# Patient Record
Sex: Male | Born: 1980 | Race: White | Hispanic: No | Marital: Single | Smoking: Former smoker
Health system: Southern US, Community
[De-identification: ages and names within clinical notes are randomized; demographics above are authoritative.]

---

## 2008-07-09 ENCOUNTER — Encounter: Payer: Self-pay | Admitting: Orthopedic Surgery

## 2008-08-15 ENCOUNTER — Ambulatory Visit: Payer: Self-pay | Admitting: Orthopedic Surgery

## 2008-08-15 DIAGNOSIS — M24819 Other specific joint derangements of unspecified shoulder, not elsewhere classified: Secondary | ICD-10-CM

## 2008-08-15 DIAGNOSIS — M25519 Pain in unspecified shoulder: Secondary | ICD-10-CM

## 2008-08-17 ENCOUNTER — Encounter (INDEPENDENT_AMBULATORY_CARE_PROVIDER_SITE_OTHER): Payer: Self-pay | Admitting: *Deleted

## 2008-08-17 DIAGNOSIS — R209 Unspecified disturbances of skin sensation: Secondary | ICD-10-CM

## 2008-08-20 ENCOUNTER — Telehealth: Payer: Self-pay | Admitting: Orthopedic Surgery

## 2008-08-23 ENCOUNTER — Encounter: Payer: Self-pay | Admitting: Orthopedic Surgery

## 2008-08-24 ENCOUNTER — Encounter: Payer: Self-pay | Admitting: Orthopedic Surgery

## 2008-08-28 ENCOUNTER — Encounter: Payer: Self-pay | Admitting: Orthopedic Surgery

## 2008-08-31 ENCOUNTER — Ambulatory Visit (HOSPITAL_COMMUNITY): Admission: RE | Admit: 2008-08-31 | Discharge: 2008-08-31 | Payer: Self-pay | Admitting: Orthopedic Surgery

## 2008-09-04 ENCOUNTER — Encounter: Payer: Self-pay | Admitting: Orthopedic Surgery

## 2008-09-20 ENCOUNTER — Ambulatory Visit: Payer: Self-pay | Admitting: Orthopedic Surgery

## 2008-09-20 DIAGNOSIS — M5412 Radiculopathy, cervical region: Secondary | ICD-10-CM | POA: Insufficient documentation

## 2009-02-12 ENCOUNTER — Ambulatory Visit (HOSPITAL_COMMUNITY): Admission: RE | Admit: 2009-02-12 | Discharge: 2009-02-12 | Payer: Self-pay | Admitting: Family Medicine

## 2012-03-03 ENCOUNTER — Other Ambulatory Visit (HOSPITAL_COMMUNITY): Payer: Self-pay | Admitting: Family Medicine

## 2012-03-03 DIAGNOSIS — M542 Cervicalgia: Secondary | ICD-10-CM

## 2012-03-10 ENCOUNTER — Other Ambulatory Visit (HOSPITAL_COMMUNITY): Payer: Self-pay

## 2012-03-11 ENCOUNTER — Ambulatory Visit (HOSPITAL_COMMUNITY): Admission: RE | Admit: 2012-03-11 | Payer: Medicare Other | Source: Ambulatory Visit

## 2012-03-11 ENCOUNTER — Ambulatory Visit (HOSPITAL_COMMUNITY): Payer: Medicare Other

## 2012-03-18 ENCOUNTER — Ambulatory Visit (HOSPITAL_COMMUNITY)
Admission: RE | Admit: 2012-03-18 | Discharge: 2012-03-18 | Disposition: A | Discharge status: Home / Self Care | Payer: Medicare Other | Source: Ambulatory Visit | Attending: Family Medicine | Admitting: Family Medicine

## 2012-03-18 DIAGNOSIS — M5020 Other cervical disc displacement, unspecified cervical region: Secondary | ICD-10-CM | POA: Insufficient documentation

## 2012-03-18 DIAGNOSIS — M542 Cervicalgia: Secondary | ICD-10-CM

## 2016-08-17 ENCOUNTER — Other Ambulatory Visit (HOSPITAL_COMMUNITY): Payer: Self-pay | Admitting: Family Medicine

## 2016-08-17 DIAGNOSIS — M542 Cervicalgia: Secondary | ICD-10-CM

## 2016-08-21 ENCOUNTER — Ambulatory Visit (HOSPITAL_COMMUNITY): Payer: Medicare Other

## 2016-08-25 ENCOUNTER — Other Ambulatory Visit (HOSPITAL_COMMUNITY): Payer: Medicare Other

## 2016-08-25 ENCOUNTER — Ambulatory Visit (HOSPITAL_COMMUNITY): Payer: Medicare Other

## 2016-09-01 ENCOUNTER — Ambulatory Visit (HOSPITAL_COMMUNITY)
Admission: RE | Admit: 2016-09-01 | Discharge: 2016-09-01 | Disposition: A | Discharge status: Home / Self Care | Payer: Medicare Other | Source: Ambulatory Visit | Attending: Family Medicine | Admitting: Family Medicine

## 2016-09-01 DIAGNOSIS — M50223 Other cervical disc displacement at C6-C7 level: Secondary | ICD-10-CM | POA: Insufficient documentation

## 2016-09-01 DIAGNOSIS — M542 Cervicalgia: Secondary | ICD-10-CM

## 2016-09-01 DIAGNOSIS — M5412 Radiculopathy, cervical region: Secondary | ICD-10-CM | POA: Insufficient documentation

## 2019-01-19 IMAGING — MR MR CERVICAL SPINE W/O CM
4 of 5 series · 14 of 48 positions shown · non-contrast
Comparison: Cervical spine MRI 03/18/2012.

CLINICAL DATA: Neck pain and bilateral arm numbness for over 10
years.

EXAM:
MRI CERVICAL SPINE WITHOUT CONTRAST
TECHNIQUE: Multiplanar, multisequence MR imaging of the cervical spine was
performed. No intravenous contrast was administered.

[Series 3: T2 · sagittal · 3.0mm · 0.39mm/px · 5 of 13 slices shown (1 of 2)]
[im 1/13]
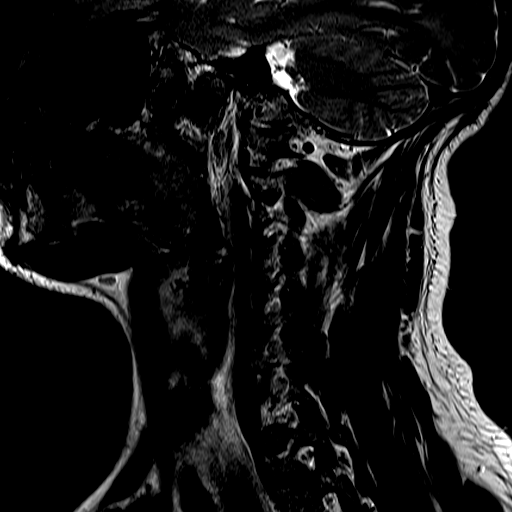
[im 3/13]
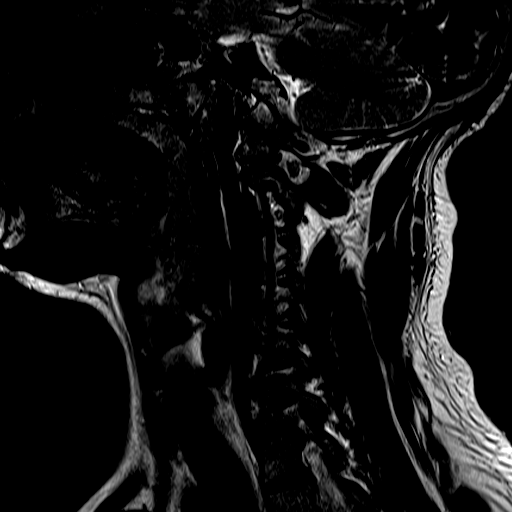
[im 5/13]
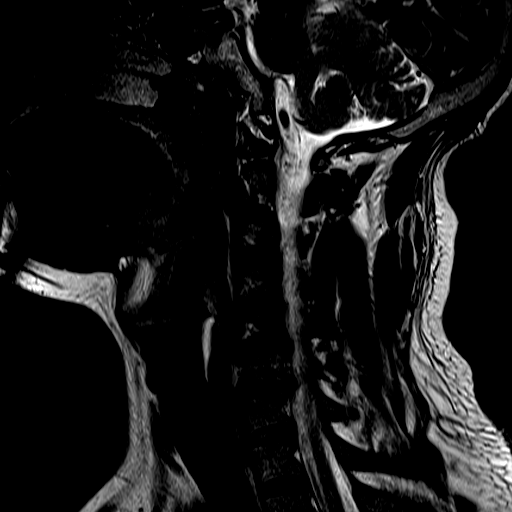
[im 8/13]
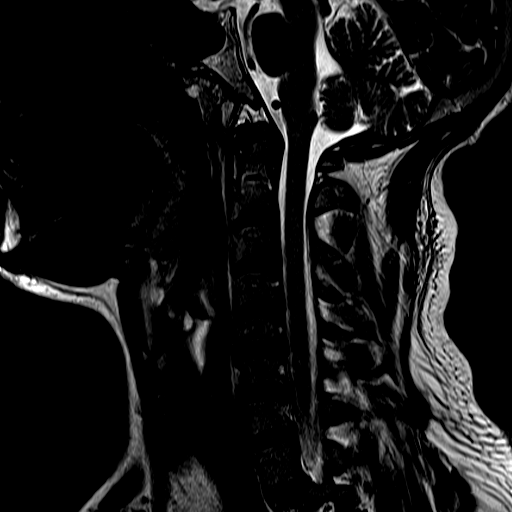
[im 13/13]
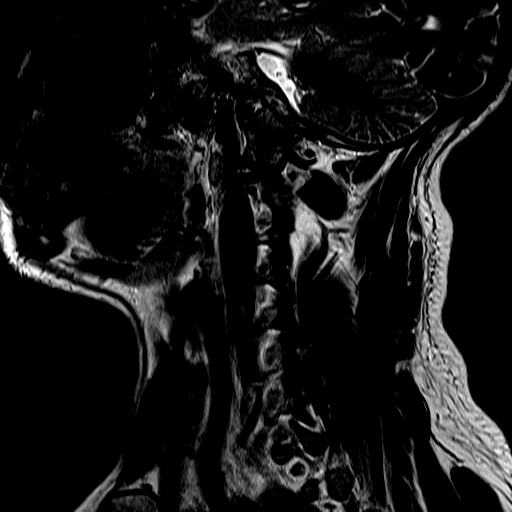

[Series 4: FLAIR · sagittal · 3.0mm · 0.45mm/px · 3 of 13 slices shown]
[im 3/13]
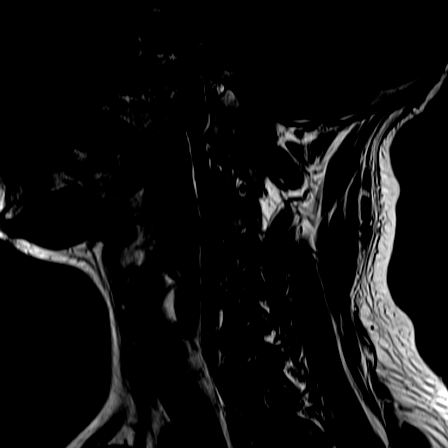
[im 8/13]
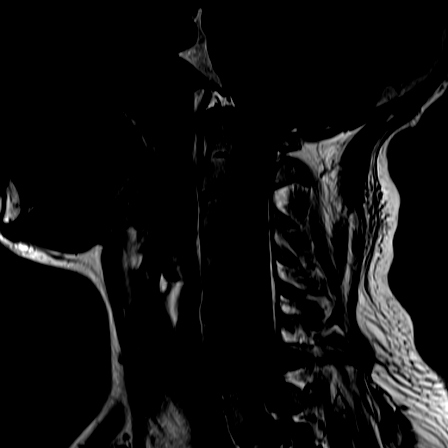
[im 13/13]
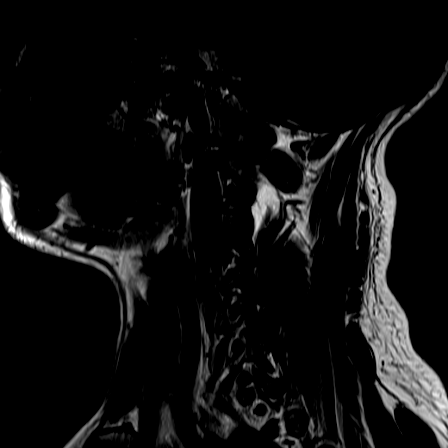

[Series 5: ir sagital · sagittal · 3.0mm · 0.22mm/px · 3 of 13 slices shown]
[im 3/13]
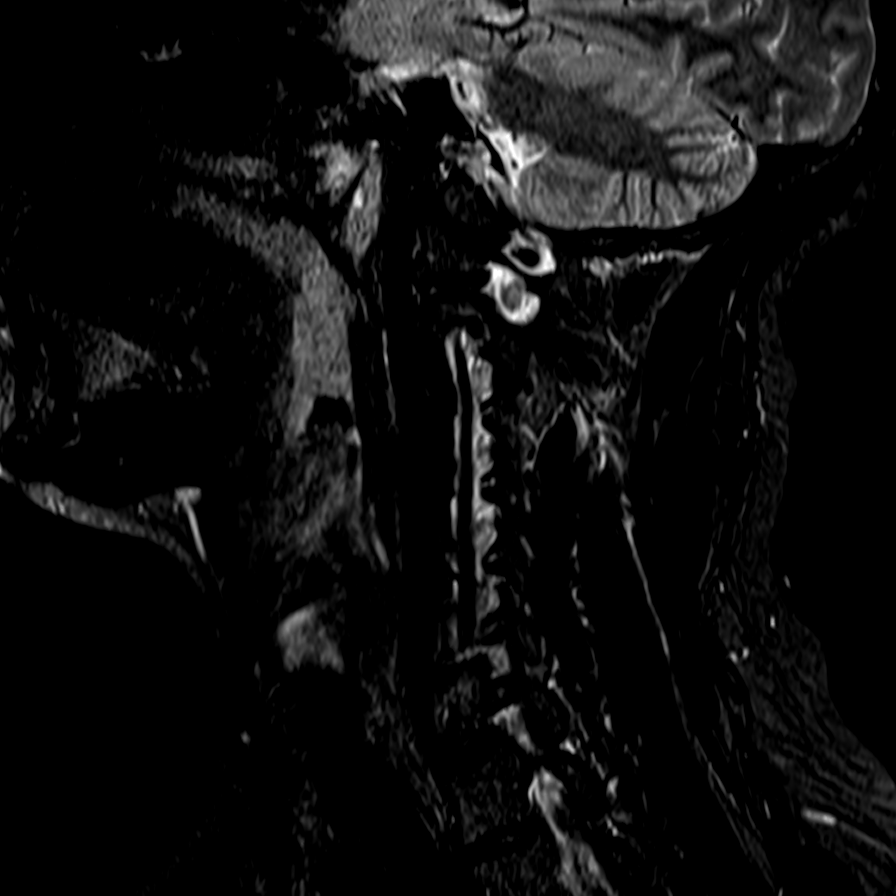
[im 8/13]
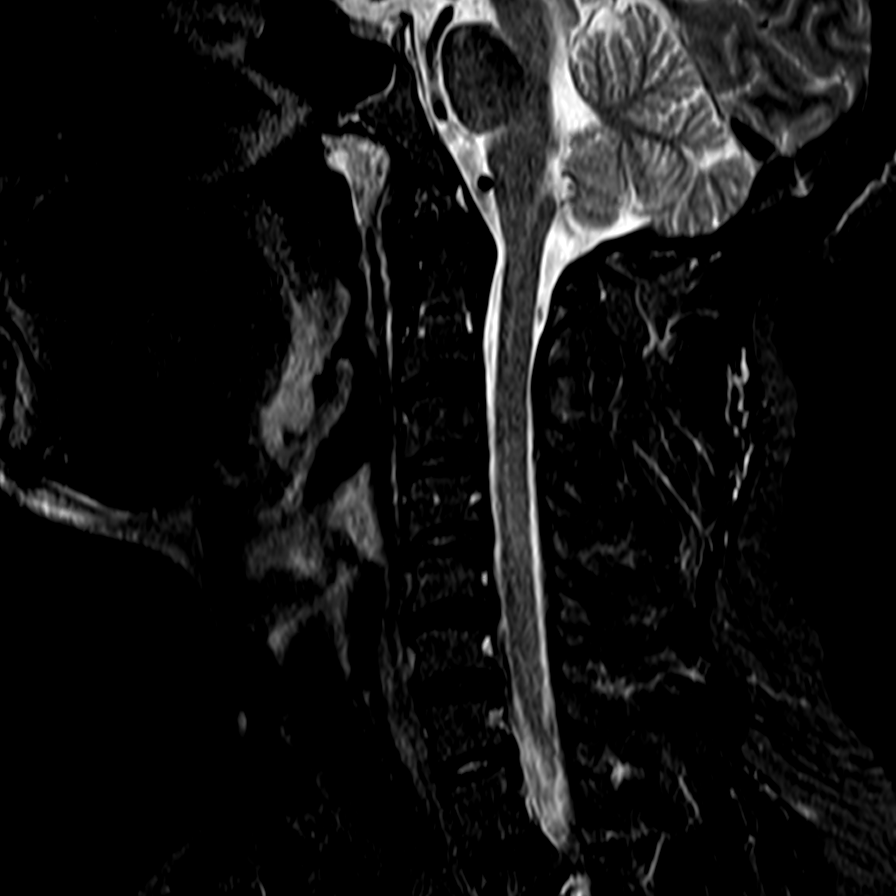
[im 13/13]
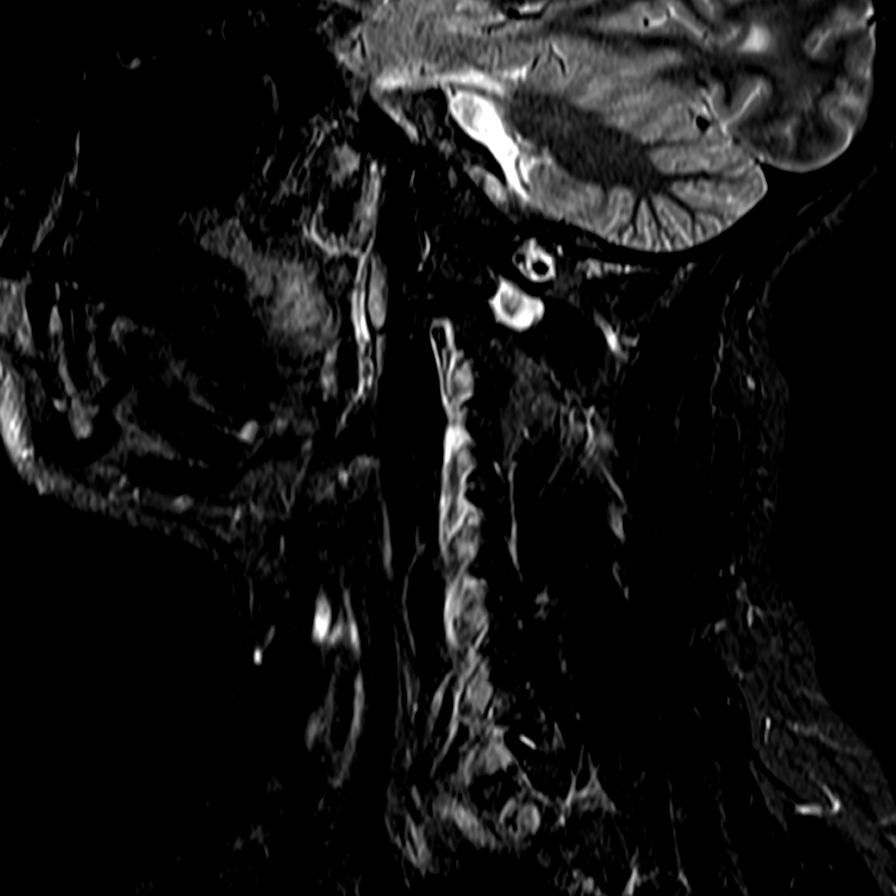

[Series 7: T2 · axial · 3.0mm · 0.23mm/px · z∈[-41,+30]mm · 3 of 32 slices shown (2 of 2)]
[im 5/32]
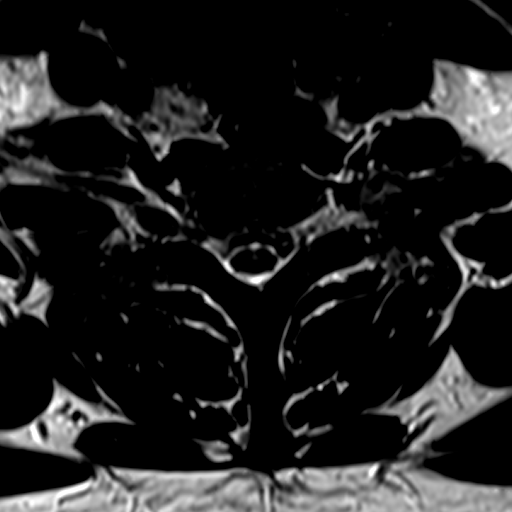
[im 16/32]
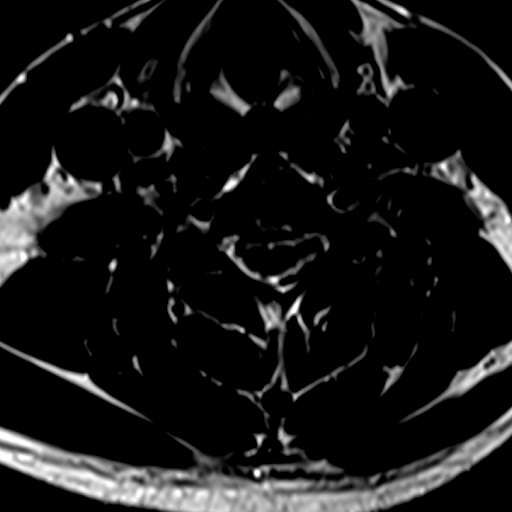
[im 27/32]
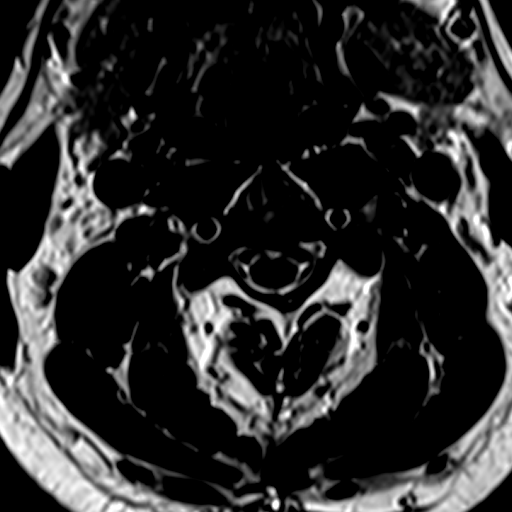

[14 of 48 positions shown; findings below may reference images not displayed]

FINDINGS: Alignment: Normal.

Vertebrae: Height and signal are maintained.

Cord: Normal signal throughout.

Posterior Fossa, vertebral arteries, paraspinal tissues: Negative.

Disc levels:

C2-3:  Negative.

C3-4: Minimal disc bulge and uncovertebral disease on the left. Mild
left foraminal narrowing is seen. The central canal and right
foramen are open.

C4-5:  Negative.

C5-6: Shallow disc bulge and uncovertebral spurring on the right.
Mild right foraminal narrowing is unchanged. The central canal and
left foramen are open.

C6-7: Minimal disc bulge with a superimposed right side disc
protrusion at the foraminal entry zone. The disc could potentially
irritate the right C7 root. The left foramen is open. The appearance
is unchanged.

C7-T1:  Negative.
IMPRESSION: No change in the appearance of the cervical spine. No new
abnormality.

Right paracentral protrusion at C6-7 at the foraminal entry zone
could irritate the right C7 root.

No change in mild left foraminal narrowing at C3-4 and right
foraminal narrowing at C5-6 due to uncovertebral disease

## 2022-07-09 ENCOUNTER — Ambulatory Visit (INDEPENDENT_AMBULATORY_CARE_PROVIDER_SITE_OTHER): Payer: Medicaid Other | Admitting: Podiatry

## 2022-07-09 DIAGNOSIS — Z91199 Patient's noncompliance with other medical treatment and regimen due to unspecified reason: Secondary | ICD-10-CM

## 2022-07-09 NOTE — Progress Notes (Signed)
No show for apt, charge generated 

## 2022-07-31 ENCOUNTER — Ambulatory Visit: Payer: Medicare Other | Admitting: Urology

## 2022-07-31 DIAGNOSIS — R3129 Other microscopic hematuria: Secondary | ICD-10-CM

## 2022-09-07 ENCOUNTER — Ambulatory Visit: Payer: Medicare Other | Admitting: Urology

## 2022-09-07 DIAGNOSIS — R310 Gross hematuria: Secondary | ICD-10-CM

## 2022-11-06 ENCOUNTER — Telehealth: Payer: Self-pay

## 2022-11-06 NOTE — Telephone Encounter (Signed)
Tried returning call from triage line with no answer, left vm for return call.

## 2022-12-08 ENCOUNTER — Encounter: Payer: Self-pay | Admitting: Gastroenterology

## 2022-12-08 ENCOUNTER — Ambulatory Visit (INDEPENDENT_AMBULATORY_CARE_PROVIDER_SITE_OTHER): Payer: Medicare Other | Admitting: Gastroenterology

## 2022-12-08 VITALS — BP 154/102 | HR 71 | Temp 97.7°F | Ht 71.0 in | Wt 212.6 lb

## 2022-12-08 DIAGNOSIS — Z5321 Procedure and treatment not carried out due to patient leaving prior to being seen by health care provider: Secondary | ICD-10-CM

## 2022-12-09 ENCOUNTER — Encounter: Payer: Self-pay | Admitting: Gastroenterology

## 2022-12-09 NOTE — Progress Notes (Signed)
Patient decided to reschedule appointment and perform recommendations given by PCP. He spoke to [facility] and front desk that he will call back to reschedule. Patient not seen by me today. No charge for visit.

## 2022-12-17 NOTE — Progress Notes (Deleted)
Name: Raymond Stevens DOB: 1980/04/05 MRN: [id number]  History of Present Illness: Mr. Raymond Stevens is a 42 y.o. male who presents today as a new patient at [facility] [place].  Recent relevant imaging: > 07/09/2022: CT abdomen/pelvis w/o contrast showed no GU stones, masses, or hydronephrosis.  > 08/19/2022: MRI pelvis showed:  - "The previously demonstrated well-circumscribed lesion in the left iliac bone demonstrates no aggressive features and has imaging features most consistent with an incidental finding such as fibrous dysplasia, intraosseous lipoma or benign cystic lesion. There is no associated soft tissue mass, periosteal reaction or abnormal soft tissue enhancement." - Distal ureters, bladder, and prostate gland appeared unremarkable.  Today: He reports chief complaint of ***gross hematuria. He reports that the visible hematuria was first noticed *** ago and {gen continuous/intermittent/once:313061} (***still ongoing ***?). He {Actions; denies-reports:120008} prior history of gross hematuria.   Today He {Actions; denies-reports:120008} urinary urgency, frequency, dysuria, straining to void, or sensations of incomplete emptying. He {Actions; denies-reports:120008} ***abdominal or ***flank pain. He {Actions; denies-reports:120008} fevers.  He {Actions; denies-reports:120008} history of kidney stones.  He {Actions; denies-reports:120008} history of pyelonephritis.  He {Actions; denies-reports:120008} history of recent or recurrent UTI. He {Actions; denies-reports:120008} history of GU malignancy or pelvic radiation.  He {Actions; denies-reports:120008} history of autoimmune disease. He {Actions; denies-reports:120008} history of smoking (quit ***; has smoked*** ppd x*** years). He {Actions; denies-reports:120008} known occupational risks. He {Actions; denies-reports:120008} recent vigorous exercise which they think may be contributory to hematuria. He {Actions;  denies-reports:120008} any recent trauma or prolonged pressure to the perineal area. He {Actions; denies-reports:120008} recent illness. He {Actions; denies-reports:120008} taking anticoagulants (***).   Fall Screening: Do you usually have a device to assist in your mobility? {yes/no:20286} ***cane / ***walker / ***wheelchair  Medications: No current outpatient medications on file.   No current facility-administered medications for this visit.    Allergies: Not on File  No past medical history on file. No past surgical history on file. No family history on file. Social History   Socioeconomic History   Marital status: Single    Spouse name: Not on file   Number of children: Not on file   Years of education: Not on file   Highest education level: Not on file  Occupational History   Not on file  Tobacco Use   Smoking status: Former    Types: Cigarettes   Smokeless tobacco: Former  Substance and Sexual Activity   Alcohol use: Not Currently   Drug use: Not Currently   Sexual activity: Yes  Other Topics Concern   Not on file  Social History Narrative   Not on file   Social Determinants of Health   Financial Resource Strain: Not on file  Food Insecurity: Not on file  Transportation Needs: Not on file  Physical Activity: Not on file  Stress: Not on file  Social Connections: Not on file  Intimate Partner Violence: Not on file    SUBJECTIVE  Review of Systems Constitutional: Patient ***denies any unintentional weight loss or change in strength lntegumentary: Patient ***denies any rashes or pruritus Eyes: Patient denies ***dry eyes ENT: Patient ***denies dry mouth Cardiovascular: Patient ***denies chest pain or syncope Respiratory: Patient ***denies shortness of breath Gastrointestinal: Patient ***denies nausea, vomiting, constipation, or diarrhea Musculoskeletal: Patient ***denies muscle cramps or weakness Neurologic: Patient ***denies convulsions or  seizures Psychiatric: Patient ***denies memory problems Allergic/Immunologic: Patient ***denies recent allergic reaction(s) Hematologic/Lymphatic: Patient denies bleeding tendencies Endocrine: Patient ***denies heat/cold intolerance  GU: As per HPI.  OBJECTIVE  There were no vitals filed for this visit. There is no height or weight on file to calculate BMI.  Physical Examination Constitutional: ***No obvious distress; patient is ***non-toxic appearing  Cardiovascular: ***No visible lower extremity edema.  Respiratory: The patient does ***not have audible wheezing/stridor; respirations do ***not appear labored  Gastrointestinal: Abdomen ***non-distended Musculoskeletal: ***Normal ROM of UEs  Skin: ***No obvious rashes/open sores  Neurologic: CN 2-12 grossly ***intact Psychiatric: Answered questions ***appropriately with ***normal affect  Hematologic/Lymphatic/Immunologic: ***No obvious bruises or sites of spontaneous bleeding  UA: ***negative / *** WBC/hpf, *** RBC/hpf, bacteria (***) PVR: *** ml  ASSESSMENT No diagnosis found.  For management of gross hematuria, we discussed possible etiologies including but not limited to: vigorous exercise, sexual activity, stone, trauma, blood thinner use, urinary tract infection, kidney function, ***BPH, ***radiation cystitis, malignancy. ***We discussed pt's smoking as a risk factor for GU cancer and encouraged ***continued smoking cessation.***  We discussed the importance of work-up including assessing the upper and lower GU tract with CT urogram and cystoscopy. We will also check ***CMP, ***CBC, and ***voided cytology.  We discussed the risk for clot retention and pt was advised to increase fluid intake to thin out clots. Pt was advised to go to the ER if they become unable to urinate due to clot retention, start having symptoms of anemia (which were discussed), or any other significant concerning acute symptoms.  Pt verbalized understanding  and decided to pursue this work-up. Patient agreed to follow-up afterward to discuss the results and formulate a treatment plan based on the findings. All questions were answered.   PLAN Advised the following: CT ***ordered. Labs (CBC, CMP, ***PSA) ordered. Voided cytology ***ordered. 4. ***No follow-ups on file.  No orders of the defined types were placed in this encounter.   It has been explained that the patient is to follow regularly with their PCP in addition to all other providers involved in their care and to follow instructions provided by these respective offices. Patient advised to contact urology clinic if any urologic-pertaining questions, concerns, new symptoms or problems arise in the interim period.  There are no Patient Instructions on file for this visit.  Electronically signed by:  Donnita Falls, MSN, FNP-C, CUNP 12/17/2022 5:32 PM

## 2022-12-22 ENCOUNTER — Ambulatory Visit: Payer: Medicare Other | Admitting: Urology

## 2022-12-22 DIAGNOSIS — R319 Hematuria, unspecified: Secondary | ICD-10-CM

## 2023-09-10 ENCOUNTER — Ambulatory Visit (INDEPENDENT_AMBULATORY_CARE_PROVIDER_SITE_OTHER): Admitting: Urology

## 2023-09-10 ENCOUNTER — Encounter: Payer: Self-pay | Admitting: Urology

## 2023-09-10 VITALS — BP 138/87 | HR 67

## 2023-09-10 DIAGNOSIS — R3129 Other microscopic hematuria: Secondary | ICD-10-CM

## 2023-09-10 DIAGNOSIS — R351 Nocturia: Secondary | ICD-10-CM

## 2023-09-10 LAB — URINALYSIS, ROUTINE W REFLEX MICROSCOPIC
Bilirubin, UA: NEGATIVE
Glucose, UA: NEGATIVE
Ketones, UA: NEGATIVE
Leukocytes,UA: NEGATIVE
Nitrite, UA: NEGATIVE
Protein,UA: NEGATIVE
Specific Gravity, UA: 1.03 (ref 1.005–1.030)
Urobilinogen, Ur: 0.2 mg/dL (ref 0.2–1.0)
pH, UA: 6 (ref 5.0–7.5)

## 2023-09-10 LAB — MICROSCOPIC EXAMINATION: Bacteria, UA: NONE SEEN

## 2023-09-10 LAB — BLADDER SCAN AMB NON-IMAGING: Scan Result: 0

## 2023-09-10 MED ORDER — DOXYCYCLINE HYCLATE 100 MG PO CAPS: 100.0000 mg | ORAL_CAPSULE | Freq: Two times a day (BID) | ORAL | 0 refills | Status: AC

## 2023-09-10 MED ORDER — ALFUZOSIN HCL ER 10 MG PO TB24: 10.0000 mg | ORAL_TABLET | Freq: Every day | ORAL | 11 refills | Status: AC

## 2023-09-10 NOTE — Progress Notes (Signed)
   09/10/2023 11:20 AM   Raymond Stevens 08/14/1980 [id number]  Referring provider: Lauran Pollard, MD [address] [place],  [place] [address]  Microhematuria   HPI: Mr Raymond Stevens is a 43yo here for evaluation of microscopic hematuria. UA today shows 11-30 RBCs. IPSS 20 QOL 6 on no BPH therapy. For the past 3 months he has noted worsening LUTS. Urine stream is weak, a feeling of incomplete emptying, nocturia 2x. No dysuria and no gross hematuria. He has pain and discomfort with ejaculation.    PMH: No past medical history on file.  Surgical History: No past surgical history on file.  Home Medications:  Allergies as of 09/10/2023   Not on File      Medication List    as of September 10, 2023 11:20 AM   You have not been prescribed any medications.     Allergies: Not on File  Family History: No family history on file.  Social History:  reports that he has quit smoking. His smoking use included cigarettes. He has quit using smokeless tobacco. He reports that he does not currently use alcohol. He reports that he does not currently use drugs.  ROS: All other review of systems were reviewed and are negative except what is noted above in HPI  Physical Exam: BP 138/87   Pulse 67   Constitutional:  Alert and oriented, No acute distress. HEENT: [place] AT, moist mucus membranes.  Trachea midline, no masses. Cardiovascular: No clubbing, cyanosis, or edema. Respiratory: Normal respiratory effort, no increased work of breathing. GI: Abdomen is soft, nontender, nondistended, no abdominal masses GU: No CVA tenderness.  Lymph: No cervical or inguinal lymphadenopathy. Skin: No rashes, bruises or suspicious lesions. Neurologic: Grossly intact, no focal deficits, moving all 4 extremities. Psychiatric: Normal mood and affect.  Laboratory Data: No results found for: WBC, HGB, HCT, MCV, PLT  No results found for: CREATININE  No results found for: PSA  No results found  for: TESTOSTERONE  No results found for: HGBA1C  Urinalysis No results found for: COLORURINE, APPEARANCEUR, LABSPEC, PHURINE, GLUCOSEU, HGBUR, BILIRUBINUR, KETONESUR, PROTEINUR, UROBILINOGEN, NITRITE, LEUKOCYTESUR  No results found for: LABMICR, WBCUA, RBCUA, LABEPIT, MUCUS, BACTERIA  Pertinent Imaging:  No results found for this or any previous visit.  No results found for this or any previous visit.  No results found for this or any previous visit.  No results found for this or any previous visit.  No results found for this or any previous visit.  No results found for this or any previous visit.  No results found for this or any previous visit.  No results found for this or any previous visit.   Assessment & Plan:    1. Microscopic hematuria (Primary) -urine for culture, we will recheck UA in 3 months after treatment of prostatitis - Urinalysis, Routine w reflex microscopic - BLADDER SCAN AMB NON-IMAGING  2. Nocturia -uroxatral 10mg  at bedtime  3. Chronic prostatitis -doxycyline 100mg  BID for 28 days   No follow-ups on file.  [name], MD  [facility] [place]

## 2023-09-10 NOTE — Patient Instructions (Signed)

## 2023-09-10 NOTE — Progress Notes (Signed)
 post void residual=0 ?

## 2023-09-12 LAB — URINE CULTURE: Organism ID, Bacteria: NO GROWTH

## 2023-09-15 ENCOUNTER — Ambulatory Visit: Payer: Self-pay

## 2023-12-31 ENCOUNTER — Ambulatory Visit: Admitting: Urology

## 2023-12-31 ENCOUNTER — Encounter: Payer: Self-pay | Admitting: Urology

## 2023-12-31 VITALS — BP 142/93 | HR 66

## 2023-12-31 DIAGNOSIS — R3129 Other microscopic hematuria: Secondary | ICD-10-CM | POA: Diagnosis not present

## 2023-12-31 DIAGNOSIS — R351 Nocturia: Secondary | ICD-10-CM | POA: Diagnosis not present

## 2023-12-31 DIAGNOSIS — N529 Male erectile dysfunction, unspecified: Secondary | ICD-10-CM | POA: Diagnosis not present

## 2023-12-31 DIAGNOSIS — Z125 Encounter for screening for malignant neoplasm of prostate: Secondary | ICD-10-CM

## 2023-12-31 LAB — URINALYSIS, ROUTINE W REFLEX MICROSCOPIC
Bilirubin, UA: NEGATIVE
Glucose, UA: NEGATIVE
Ketones, UA: NEGATIVE
Leukocytes,UA: NEGATIVE
Nitrite, UA: NEGATIVE
Specific Gravity, UA: 1.025 (ref 1.005–1.030)
Urobilinogen, Ur: 0.2 mg/dL (ref 0.2–1.0)
pH, UA: 6 (ref 5.0–7.5)

## 2023-12-31 LAB — MICROSCOPIC EXAMINATION
Bacteria, UA: NONE SEEN
WBC, UA: NONE SEEN /HPF (ref 0–5)

## 2023-12-31 MED ORDER — TADALAFIL 5 MG PO TABS: 5.0000 mg | ORAL_TABLET | Freq: Every day | ORAL | 11 refills | Status: AC

## 2023-12-31 MED ORDER — GEMTESA 75 MG PO TABS: 1.0000 | ORAL_TABLET | Freq: Every day | ORAL | Status: AC

## 2023-12-31 NOTE — Progress Notes (Signed)
   12/31/2023 12:57 PM   Lonni CHRISTELLA Clarity 06/11/1980 [id number]  Referring provider: Trudy Vaughn FALCON, MD [address] [place],  [place] [address]  Followup difficulty urinating   HPI: Mr Colgate is a 43yo here for followup for for microhematuria and difficulty urinating. He took the uroxatral  10mg  for a month and then stopped the medication. He took 28 days of doxycycline  for prostatitis. He cannot recall if the uroxatral  10mg  improved his LUTS. He continues to have issues maintaining an erection.     PMH: No past medical history on file.  Surgical History: No past surgical history on file.  Home Medications:  Allergies as of 12/31/2023   Not on File      Medication List        Accurate as of December 31, 2023 12:57 PM. If you have any questions, ask your nurse or doctor.          alfuzosin  10 MG 24 hr tablet Commonly known as: UROXATRAL  Take 1 tablet (10 mg total) by mouth at bedtime.        Allergies: Not on File  Family History: No family history on file.  Social History:  reports that he has quit smoking. His smoking use included cigarettes. He has quit using smokeless tobacco. He reports that he does not currently use alcohol. He reports that he does not currently use drugs.  ROS: All other review of systems were reviewed and are negative except what is noted above in HPI  Physical Exam: BP (!) 142/93   Pulse 66   Constitutional:  Alert and oriented, No acute distress. HEENT: [place] AT, moist mucus membranes.  Trachea midline, no masses. Cardiovascular: No clubbing, cyanosis, or edema. Respiratory: Normal respiratory effort, no increased work of breathing. GI: Abdomen is soft, nontender, nondistended, no abdominal masses GU: No CVA tenderness.  Lymph: No cervical or inguinal lymphadenopathy. Skin: No rashes, bruises or suspicious lesions. Neurologic: Grossly intact, no focal deficits, moving all 4 extremities. Psychiatric: Normal mood and  affect.  Laboratory Data: No results found for: WBC, HGB, HCT, MCV, PLT  No results found for: CREATININE  No results found for: PSA  No results found for: TESTOSTERONE  No results found for: HGBA1C  Urinalysis    Component Value Date/Time   APPEARANCEUR Clear 09/10/2023 1117   GLUCOSEU Negative 09/10/2023 1117   BILIRUBINUR Negative 09/10/2023 1117   PROTEINUR Negative 09/10/2023 1117   NITRITE Negative 09/10/2023 1117   LEUKOCYTESUR Negative 09/10/2023 1117    Lab Results  Component Value Date   LABMICR See below: 09/10/2023   WBCUA 0-5 09/10/2023   LABEPIT 0-10 09/10/2023   BACTERIA None seen 09/10/2023    Pertinent Imaging:  No results found for this or any previous visit.  No results found for this or any previous visit.  No results found for this or any previous visit.  No results found for this or any previous visit.  No results found for this or any previous visit.  No results found for this or any previous visit.  No results found for this or any previous visit.  No results found for this or any previous visit.   Assessment & Plan:    1. Microscopic hematuria (Primary) Followup 3 months with UA - Urinalysis, Routine w reflex microscopic  2. Nocturia -tadalafil 5mg  daily -gemtesa 75mg   3. Erectile dysfunction -tadalafil 5mg  daily   No follow-ups on file.  Belvie Clara, MD  [facility] [place]

## 2023-12-31 NOTE — Patient Instructions (Signed)

## 2024-01-01 LAB — PSA: Prostate Specific Ag, Serum: 2 ng/mL (ref 0.0–4.0)

## 2024-01-13 ENCOUNTER — Ambulatory Visit: Payer: Self-pay | Admitting: Urology

## 2024-03-22 ENCOUNTER — Encounter (INDEPENDENT_AMBULATORY_CARE_PROVIDER_SITE_OTHER): Payer: Self-pay | Admitting: *Deleted

## 2024-04-06 ENCOUNTER — Ambulatory Visit: Admitting: Podiatry

## 2024-05-02 ENCOUNTER — Encounter (INDEPENDENT_AMBULATORY_CARE_PROVIDER_SITE_OTHER): Payer: Self-pay | Admitting: *Deleted

## 2024-05-08 ENCOUNTER — Ambulatory Visit: Admitting: Urology

## 2024-05-17 ENCOUNTER — Ambulatory Visit: Admitting: Internal Medicine

## 2024-10-13 ENCOUNTER — Ambulatory Visit: Admitting: Urology
